# Patient Record
Sex: Female | Born: 1985 | Hispanic: Yes | Marital: Married
Health system: Southern US, Community
[De-identification: ages and names within clinical notes are randomized; demographics above are authoritative.]

---

## 2005-08-04 ENCOUNTER — Ambulatory Visit: Payer: Self-pay | Admitting: Family Medicine

## 2005-11-28 ENCOUNTER — Ambulatory Visit: Payer: Self-pay | Admitting: Family Medicine

## 2005-12-24 ENCOUNTER — Inpatient Hospital Stay: Payer: Self-pay | Admitting: Obstetrics and Gynecology

## 2008-02-01 IMAGING — US US OB US >=[ID] SNGL FETUS
1 series · 17 of 28 positions shown · non-contrast
Comparison: none

REASON FOR EXAM: Evaluate position of baby possibly breach
COMMENTS:

[Series 1: us ob us >=(id) sngl fetus · 17 of 30 slices shown]
[im 1/30]
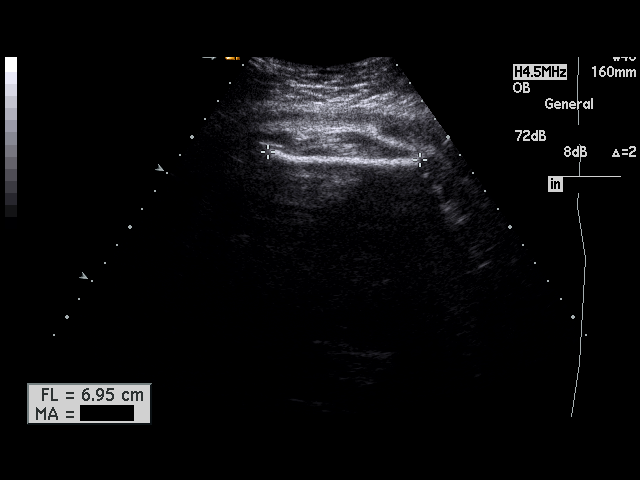
[im 3/30]
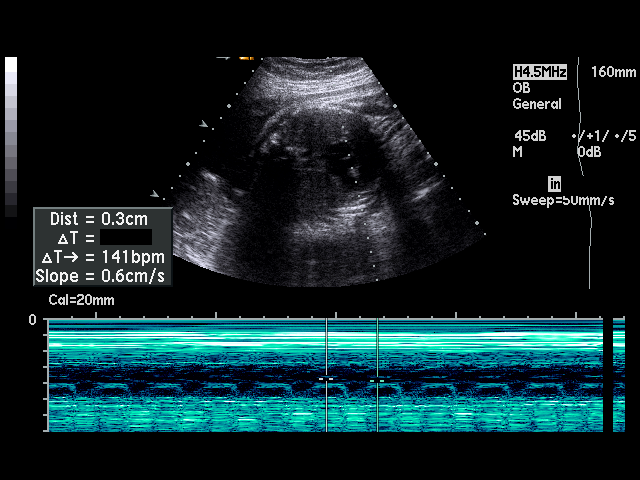
[im 5/30]
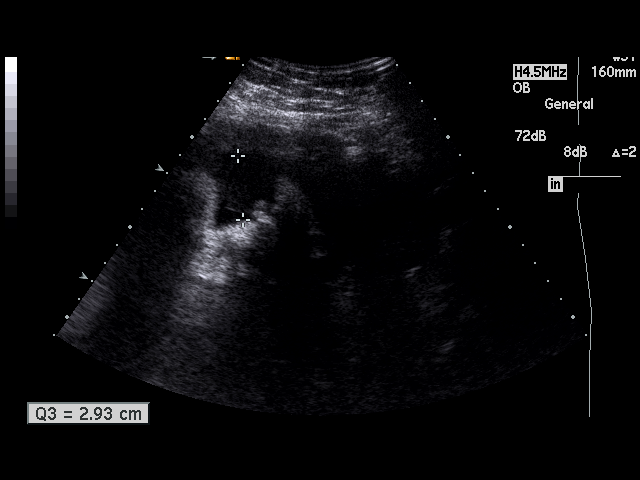
[im 6/30]
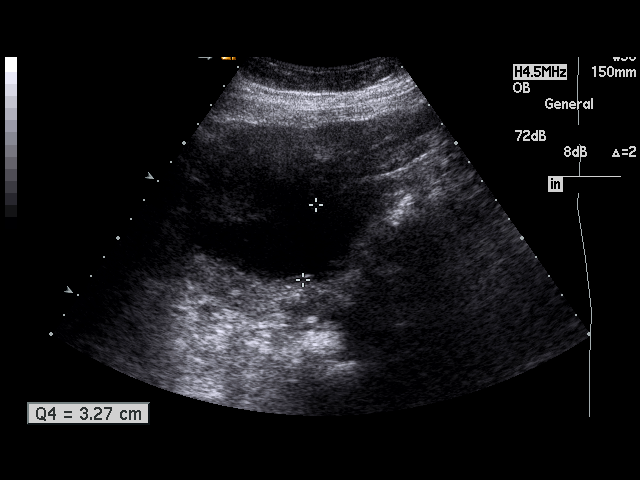
[im 8/30]
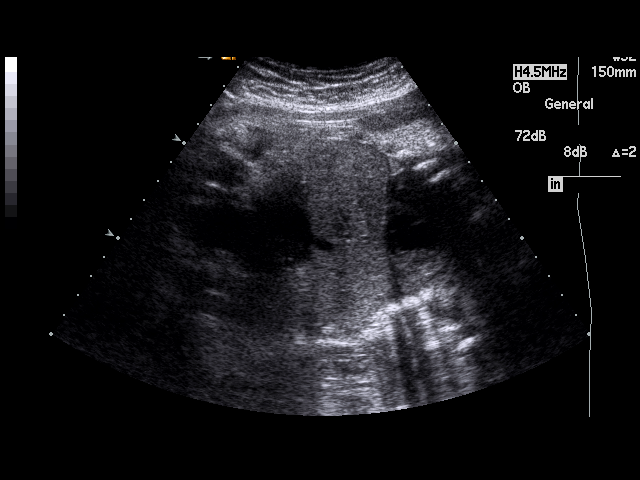
[im 10/30]
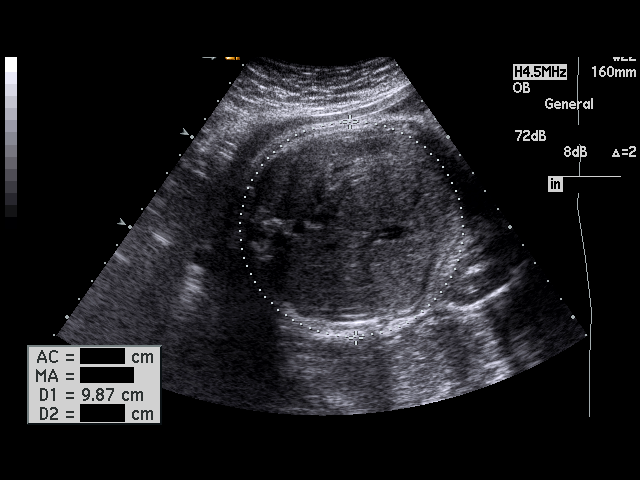
[im 11/30]
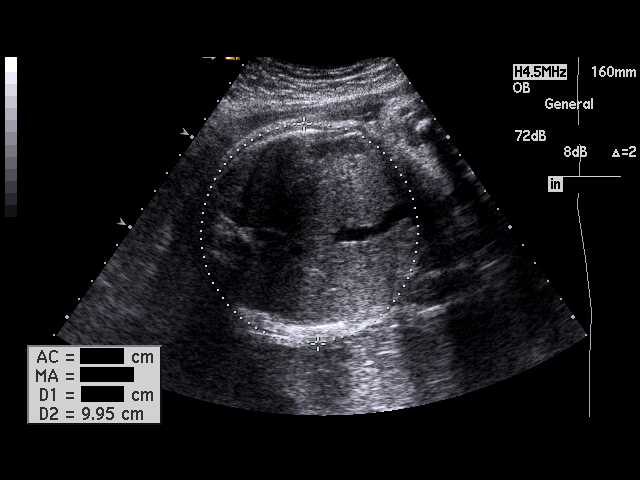
[im 13/30]
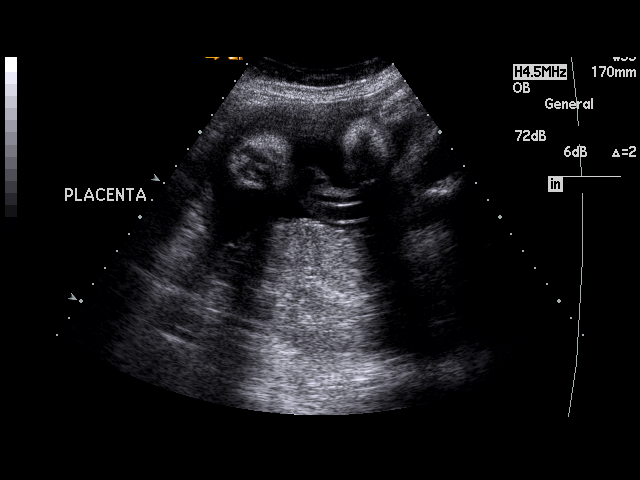
[im 16/30]
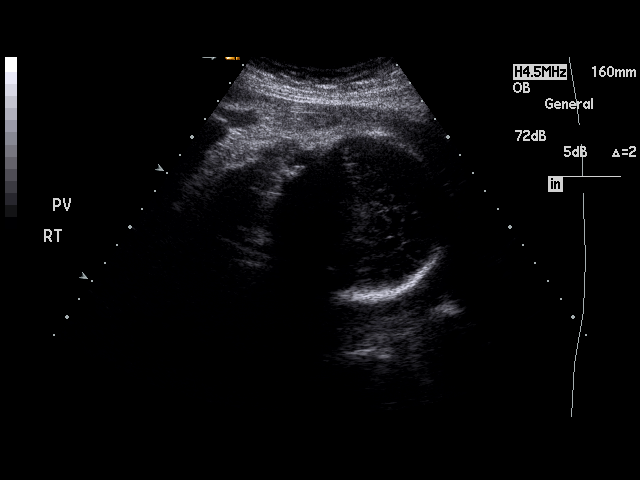
[im 17/30]
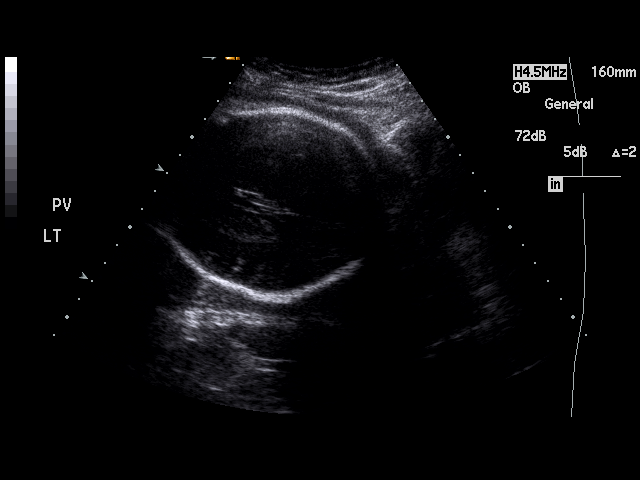
[im 19/30]
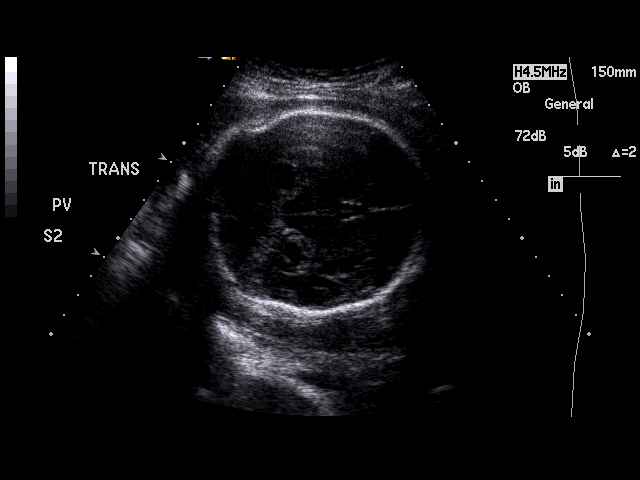
[im 20/30]
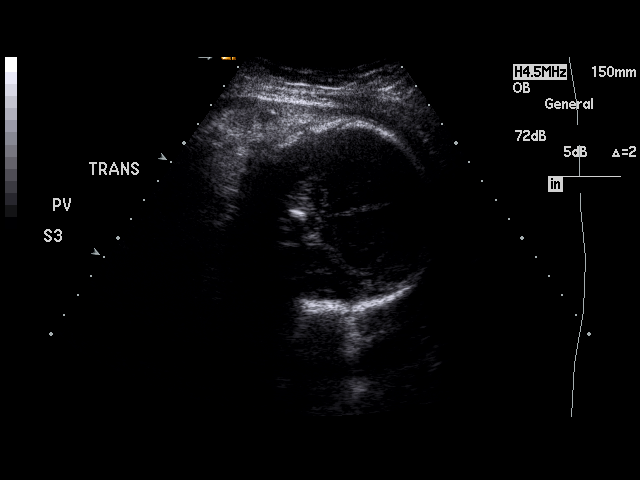
[im 22/30]
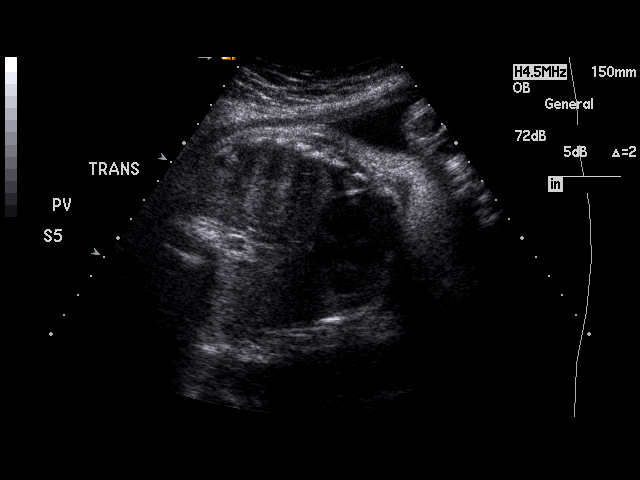
[im 24/30]
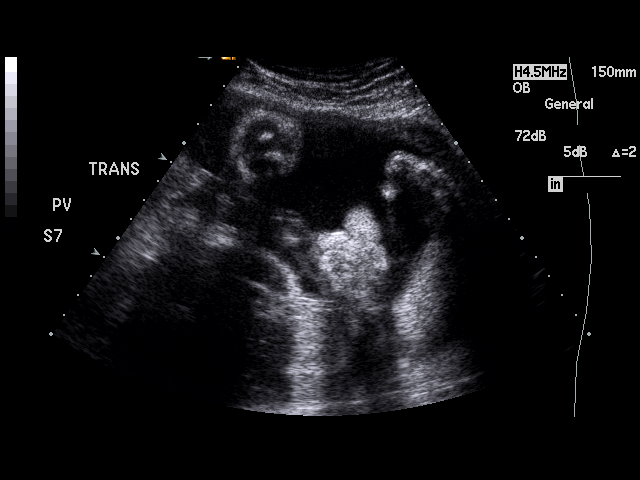
[im 25/30]
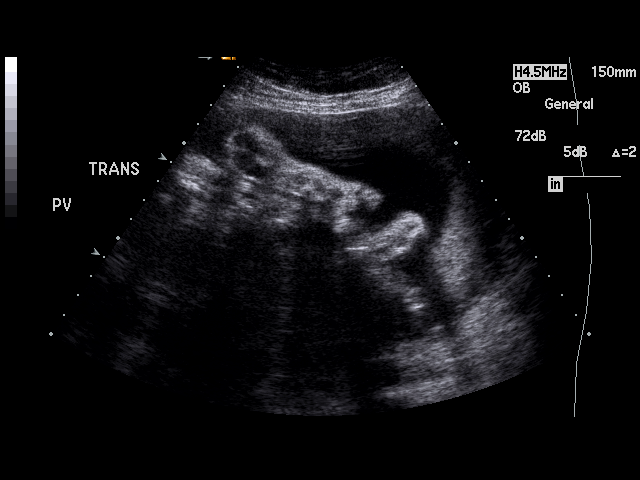
[im 27/30]
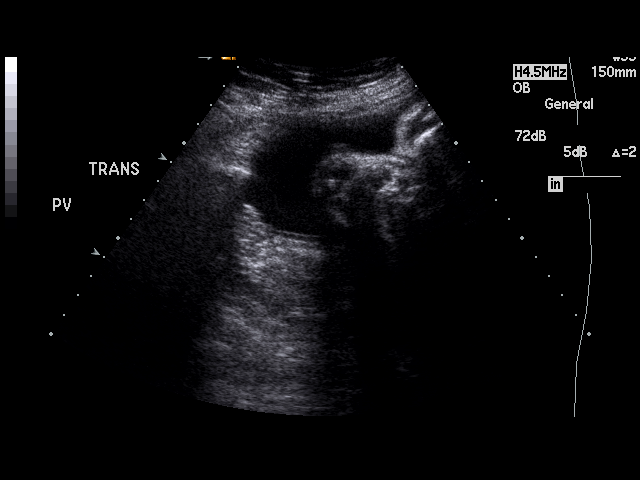
[im 30/30]
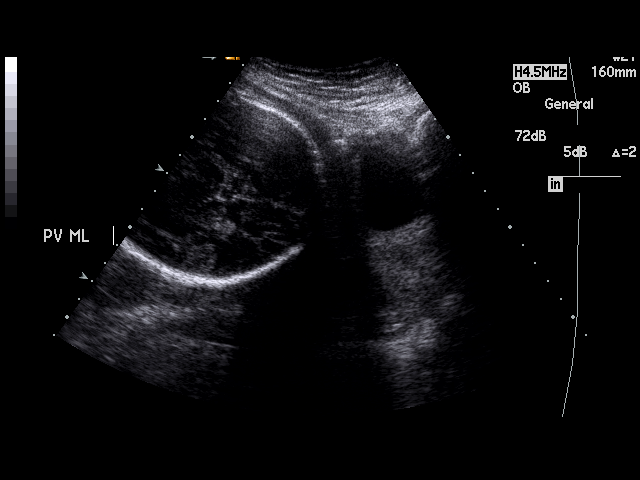

[17 of 28 positions shown; findings below may reference images not displayed]

PROCEDURE:     US  - US OB GREATER/OR EQUAL TO [id number]  - November 28, 2005 [DATE]

RESULT:       There is a single living intrauterine gestation.
Presentation currently is cephalic. The placenta is posterior.  Amniotic
fluid volume appears normal.  Fetal heart rate was monitored at 141 beats
per minute.   Fetal measurements are as follows:

BPD     8.73 cm      35 weeks 2 days
HC          31.77 cm     35 weeks 5 days
AC          31.51 cm     35 weeks 3 days
FL          6.96 cm     35 weeks 5 days

AFI equal 12.61 cm.
Average ultrasound age:  35 weeks 2 days
Ultrasound EDD:  December 31, 2005.

There has been appropriate interval growth since the prior exam.
IMPRESSION: Please see above.

## 2011-10-31 ENCOUNTER — Ambulatory Visit: Payer: Self-pay | Admitting: Advanced Practice Midwife

## 2012-01-26 ENCOUNTER — Observation Stay: Payer: Self-pay | Admitting: Obstetrics and Gynecology

## 2012-01-26 LAB — PIH PROFILE
Anion Gap: 8 (ref 7–16)
BUN: 11 mg/dL (ref 7–18)
Calcium, Total: 8.7 mg/dL (ref 8.5–10.1)
Chloride: 107 mmol/L (ref 98–107)
Co2: 23 mmol/L (ref 21–32)
Creatinine: 0.53 mg/dL — ABNORMAL LOW (ref 0.60–1.30)
EGFR (African American): >60
HGB: 10.7 g/dL — ABNORMAL LOW (ref 12.0–16.0)
MCHC: 34.9 g/dL (ref 32.0–36.0)
Osmolality: 273 (ref 275–301)
Platelet: 180 10*3/uL (ref 150–440)
RBC: 3.29 10*6/uL — ABNORMAL LOW (ref 3.80–5.20)
SGOT(AST): 35 U/L (ref 15–37)
Uric Acid: 5.7 mg/dL (ref 2.6–6.0)
WBC: 9 10*3/uL (ref 3.6–11.0)

## 2012-01-26 LAB — PROTEIN / CREATININE RATIO, URINE
Creatinine, Urine: 43 mg/dL (ref 30.0–125.0)
Protein, Random Urine: 65 mg/dL — ABNORMAL HIGH (ref 0–12)
Protein/Creat. Ratio: 1512 mg/gCREAT — ABNORMAL HIGH (ref 0–200)

## 2012-01-27 LAB — PROTEIN, URINE, 24 HOUR
Protein, 24 Hour Urine: 624 mg/24HR — ABNORMAL HIGH (ref 30–149)
Protein, Urine: 48 mg/dL (ref 0–12)
Total Volume: 1300 mL

## 2012-01-30 ENCOUNTER — Observation Stay: Payer: Self-pay | Admitting: Obstetrics and Gynecology

## 2012-01-30 LAB — PIH PROFILE
Anion Gap: 8 (ref 7–16)
Creatinine: 0.53 mg/dL — ABNORMAL LOW (ref 0.60–1.30)
EGFR (African American): >60
EGFR (Non-African Amer.): >60
Glucose: 64 mg/dL — ABNORMAL LOW (ref 65–99)
HCT: 32.3 % — ABNORMAL LOW (ref 35.0–47.0)
HGB: 11.2 g/dL — ABNORMAL LOW (ref 12.0–16.0)
MCHC: 34.6 g/dL (ref 32.0–36.0)
Platelet: 184 10*3/uL (ref 150–440)
SGOT(AST): 34 U/L (ref 15–37)
Sodium: 140 mmol/L (ref 136–145)
Uric Acid: 5.1 mg/dL (ref 2.6–6.0)
WBC: 8.4 10*3/uL (ref 3.6–11.0)

## 2014-01-03 IMAGING — US US OB US >=[ID] SNGL FETUS
1 series · 14 of 28 positions shown · non-contrast
Comparison: none

REASON FOR EXAM: dating low anatomy
COMMENTS:

PROCEDURE:     US  - US OB GREATER/OR EQUAL TO [id number]  - October 31, 2011  [DATE]
RESULT:

[Series 1: us ob us >=(id) sngl fetus · 0.25mm/px · 14 of 85 slices shown]
[im 4/85]
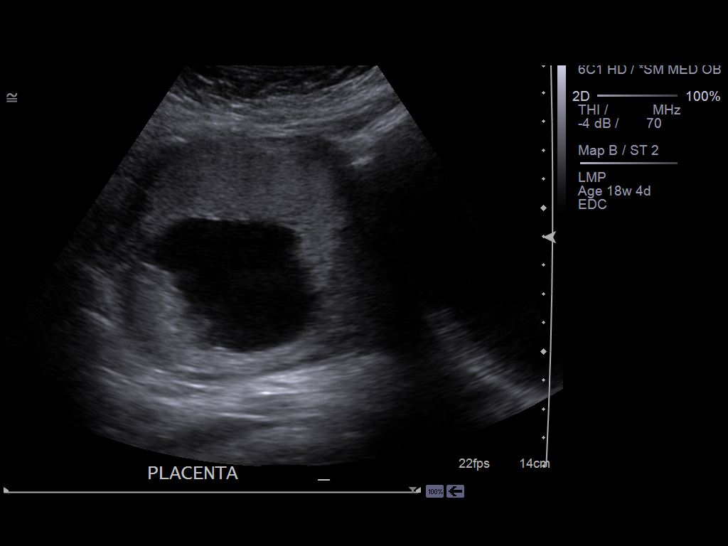
[im 10/85]
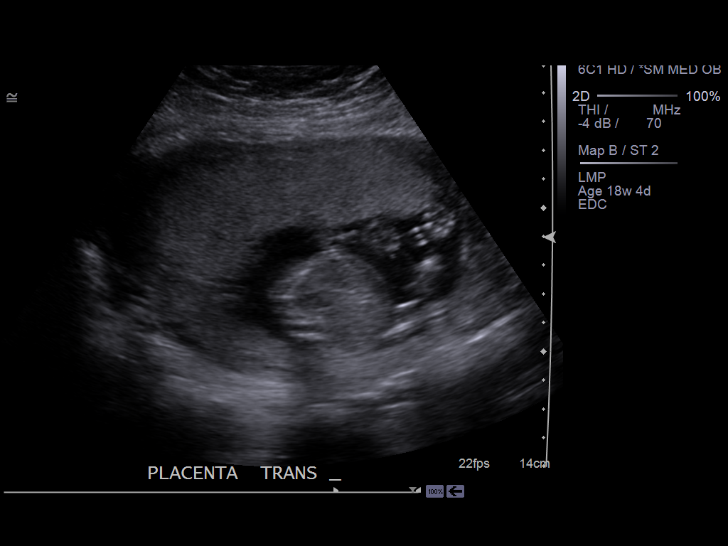
[im 16/85]
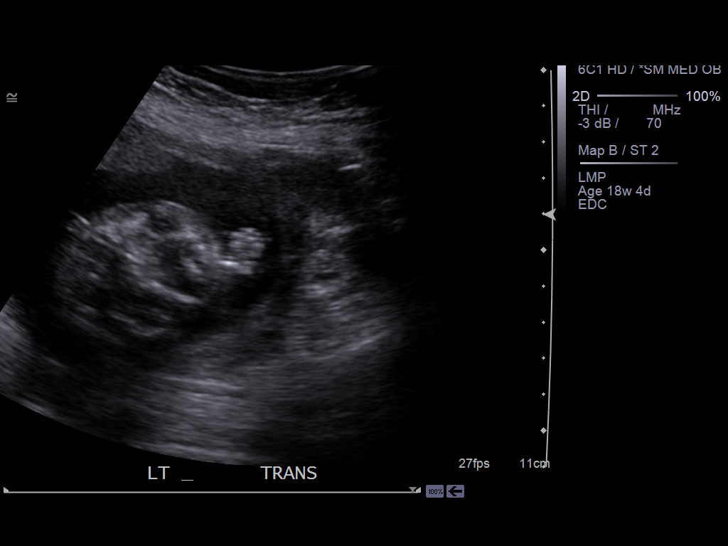
[im 22/85]
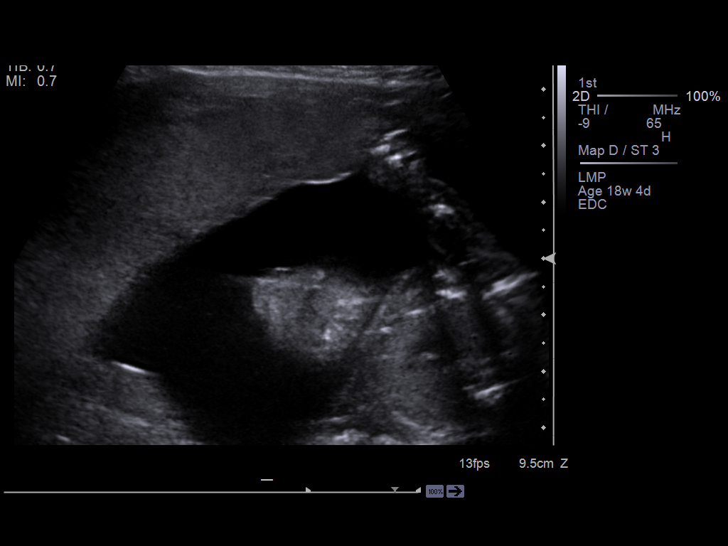
[im 29/85]
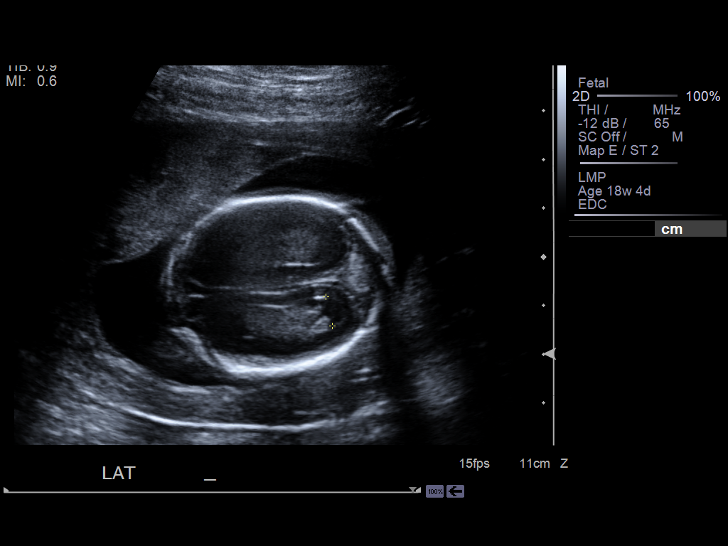
[im 35/85]
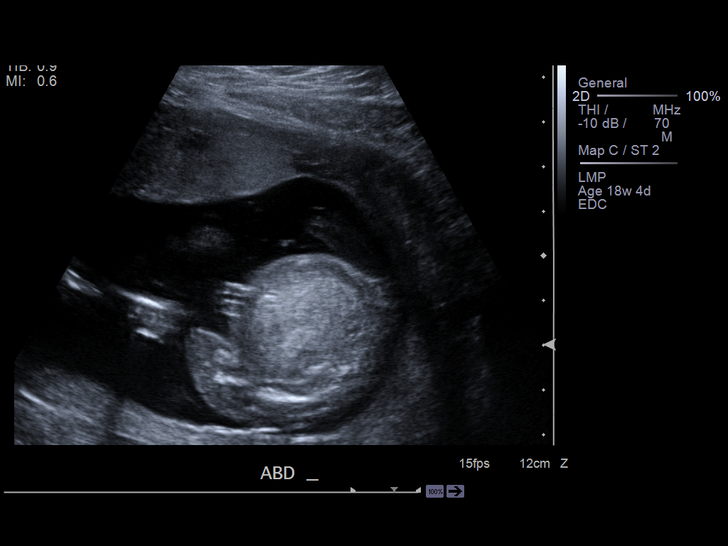
[im 41/85]
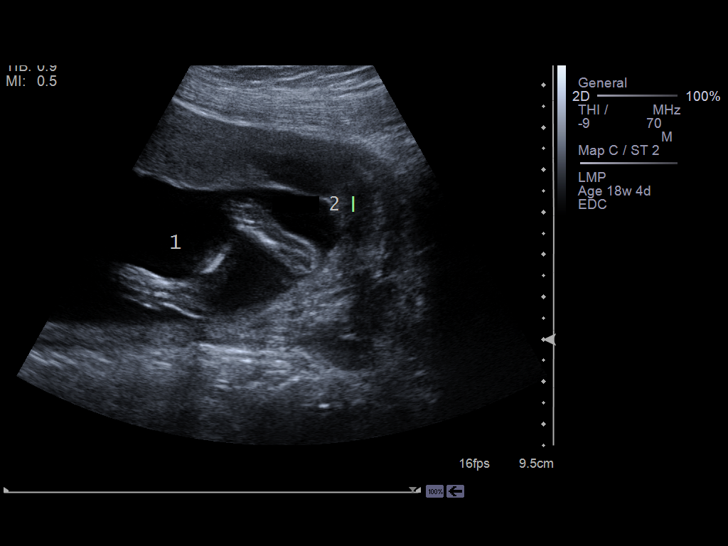
[im 47/85]
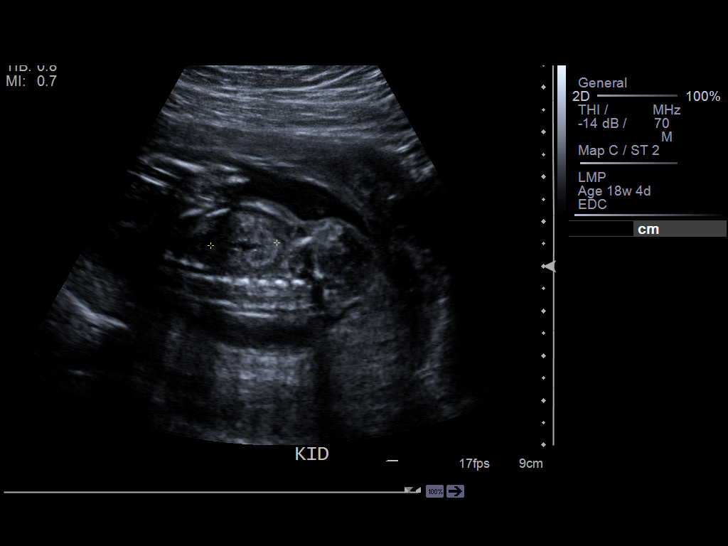
[im 53/85]
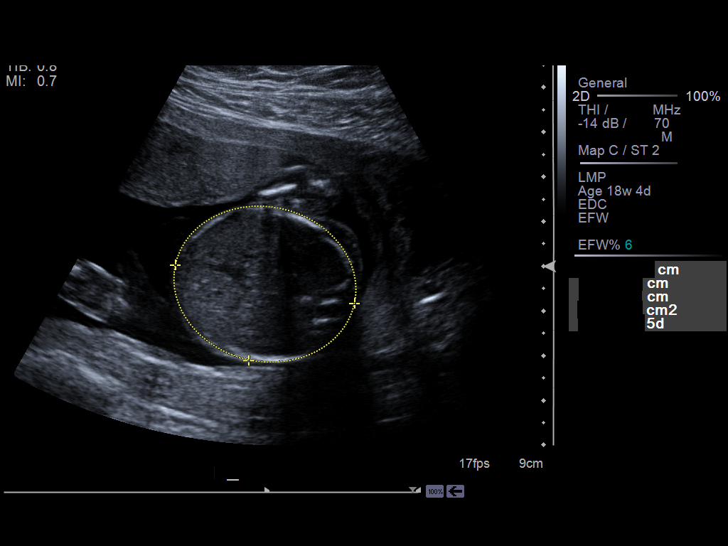
[im 60/85]
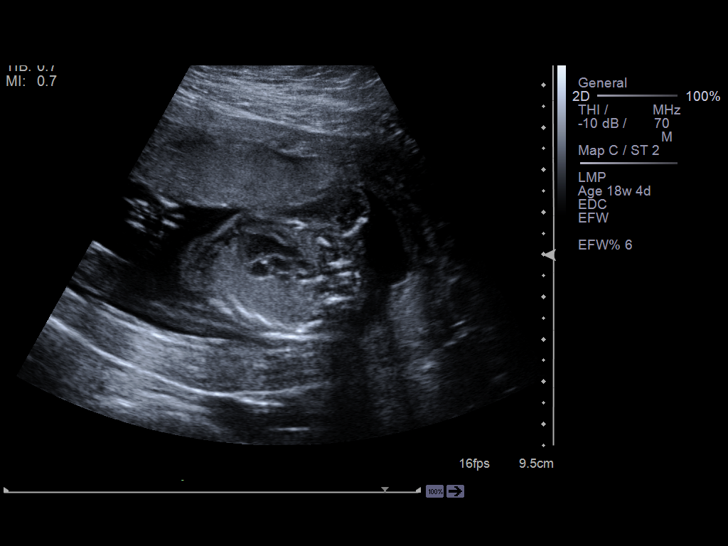
[im 66/85]
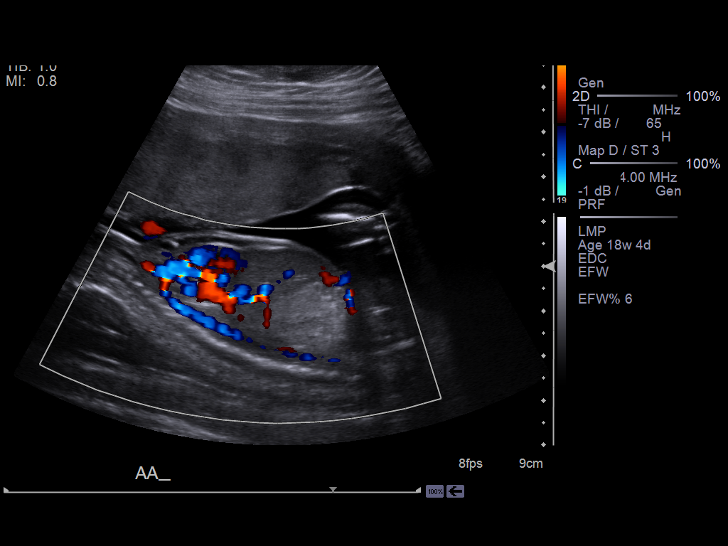
[im 72/85]
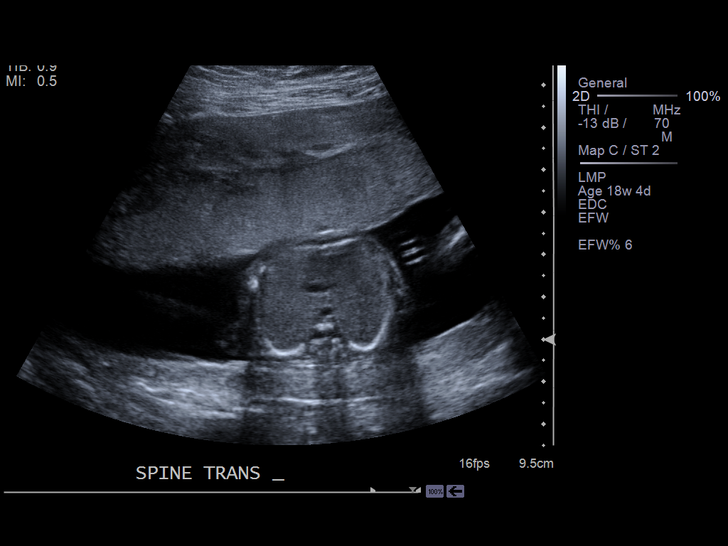
[im 78/85]
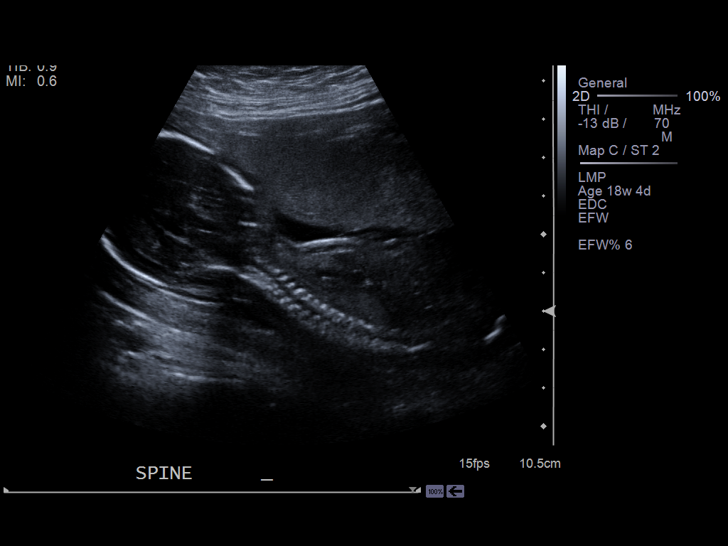
[im 85/85]
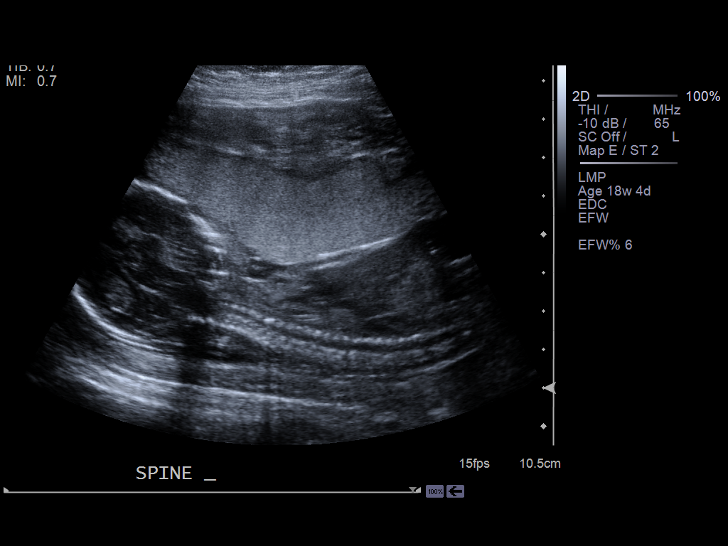

[14 of 28 positions shown; findings below may reference images not displayed]

FINDINGS: A single, viable intrauterine pregnancy is appreciated with
estimated gestational age of 17 weeks, 4 days and EDC of 04/05/2012.
Estimated fetal weight of 201 grams and a heart rate of 144 beats per minute
is appreciated.

Cardiac, trunk and extremity activity is identified. The placental tip rests
approximately 4.19 cm from the cervical os and the placenta is Grade I in an
anterior location. The cervical length is 6.27 cm and the os appears closed.
Amniotic fluid is visually unremarkable. The placenta demonstrates a
homogeneous echotexture. The bladder, renal, stomach, cardiac, diaphragm and
ventricles appear unremarkable. There is limited evaluation of the spine.

Fetal biometry:

BPD is 3.73 cm; EGA 17 weeks, 3 days

HC is 13.91 cm; EGA 17 weeks, 2 days

AC is 11.97 cm; EGA 17 weeks, 5 days

FL is 2.51 cm; EGA 17 weeks 4 days

HL is 2.43 cm; EGA 17 weeks 4 days
IMPRESSION: Single, viable intrauterine pregnancy as described above.

## 2019-07-26 ENCOUNTER — Ambulatory Visit: Payer: Self-pay | Attending: Internal Medicine

## 2019-07-26 DIAGNOSIS — Z23 Encounter for immunization: Secondary | ICD-10-CM

## 2019-07-26 NOTE — Progress Notes (Signed)
   Covid-19 Vaccination Clinic  Name:  Elizabeth Bradford    MRN: [id number] DOB: February 04, 1986  07/26/2019  Ms. Elizabeth Bradford was observed post Covid-19 immunization for 15 minutes without incident. She was provided with Vaccine Information Sheet and instruction to access the V-Safe system.   Ms. Elizabeth Bradford was instructed to call 911 with any severe reactions post vaccine: Marland Kitchen Difficulty breathing  . Swelling of face and throat  . A fast heartbeat  . A bad rash all over body  . Dizziness and weakness   Immunizations Administered    Name Date Dose VIS Date Route   Pfizer COVID-19 Vaccine 07/26/2019 10:06 AM 0.3 mL 05/01/2018 Intramuscular   Manufacturer: [employer], [employer]   Lot: [id number]   NDC: [address]-1

## 2019-08-16 ENCOUNTER — Ambulatory Visit: Payer: Self-pay | Attending: Internal Medicine

## 2019-08-16 DIAGNOSIS — Z23 Encounter for immunization: Secondary | ICD-10-CM

## 2019-08-16 NOTE — Progress Notes (Signed)
   Covid-19 Vaccination Clinic  Name:  Elizabeth Bradford    MRN: [id number] DOB: [birth year]  08/16/2019  Ms. Elizabeth Bradford was observed post Covid-19 immunization for 15 minutes without incident. She was provided with Vaccine Information Sheet and instruction to access the V-Safe system.   Ms. Elizabeth Bradford was instructed to call 911 with any severe reactions post vaccine: Marland Kitchen Difficulty breathing  . Swelling of face and throat  . A fast heartbeat  . A bad rash all over body  . Dizziness and weakness   Immunizations Administered    Name Date Dose VIS Date Route   Pfizer COVID-19 Vaccine 08/16/2019  9:08 AM 0.3 mL 05/01/2018 Intramuscular   Manufacturer: [employer], [employer]   Lot: [id number]   NDC: [address]-7
